# Patient Record
Sex: Male | Born: 2000 | Race: Black or African American | Hispanic: No | Marital: Single | Smoking: Never smoker
Health system: Southern US, Community
[De-identification: ages and names within clinical notes are randomized; demographics above are authoritative.]

## PROBLEM LIST (undated history)

## (undated) DIAGNOSIS — F909 Attention-deficit hyperactivity disorder, unspecified type: Secondary | ICD-10-CM

## (undated) HISTORY — PX: CARDIAC SURGERY: SHX584

## (undated) HISTORY — PX: KNEE SURGERY: SHX244

---
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
## 2021-05-05 ENCOUNTER — Ambulatory Visit: Payer: 59 | Admitting: Nurse Practitioner

## 2021-05-05 ENCOUNTER — Encounter: Payer: Self-pay | Admitting: Nurse Practitioner

## 2021-05-05 ENCOUNTER — Other Ambulatory Visit: Payer: Self-pay

## 2021-05-05 VITALS — BP 112/60 | HR 81 | Temp 98.5°F | Ht 69.6 in | Wt 190.8 lb

## 2021-05-05 DIAGNOSIS — Q245 Malformation of coronary vessels: Secondary | ICD-10-CM | POA: Diagnosis not present

## 2021-05-05 DIAGNOSIS — Z8659 Personal history of other mental and behavioral disorders: Secondary | ICD-10-CM | POA: Diagnosis not present

## 2021-05-05 DIAGNOSIS — Z7689 Persons encountering health services in other specified circumstances: Secondary | ICD-10-CM

## 2021-05-05 NOTE — Patient Instructions (Signed)
HPV and Cancer Information HPV (human papillomavirus)is a very common virus that spreads easily from person to person through skin-to-skin or sexual contact. There are many types of HPV. It often does not cause symptoms. However, depending upon the type, it may sometimes cause warts in the genitals (genital or mucosal HPV), or on the hands or feet (cutaneous or nonmucosal HPV). It is possible to be infected for a long time and pass HPV to others withoutknowing it. Some HPV infections go away on their own within 2 years, but other HPV infections are considered high-risk and may cause changes in cells that could lead to cancer. You can take steps to avoid HPV infection and to lower yourrisk of getting cancer. How can HPV affect me? HPV can cause warts in the genitals or on the hands or feet. It can also causewart-like lesions in the throat.  Certain types of genital HPV can also cause cancer, which may include: Cervical cancer. Vaginal cancer. Vulvar cancer. Anal cancer. Throat cancer. Tongue or mouth cancer. Penile cancer. How does HPV spread? HPV spreads easily through direct person to person contact. Genital HPV spreads through sexual contact. You can get HPV from vaginal sex, oral sex, anal sex, or just by touching someone's genitals. Even people who have only one sexual partner may have HPV because that partner may have it. HPV often does not causesymptoms, so most infected people do not know that they have it. What actions can I take to prevent HPV? Take the following steps to help prevent HPV infection: Talk with your health care provider about getting the HPV vaccine. This vaccine protects against the types of HPV that could cause cancer. Limit the number of people you have sex with. Also, avoid having sex with people who have had many sexual partners. Use a condom during sex. Talk with your sexual partners about their health. What actions can I take to lower my risk for cancer? Having a  healthy lifestyle and taking some preventive steps can help lower your cancer risk, whether or not you have genital HPV. Some steps you can takeinclude: Lifestyle Practice safe sex to help prevent HPV infection. Do not use any products that contain nicotine or tobacco, such as cigarettes, e-cigarettes, and chewing tobacco. If you need help quitting, ask your health care provider. Eat foods that have antioxidants, such as fruits, vegetables, and grains. Try to eat at least 5 servings of fruits and vegetables every day. Get regular exercise. Lose weight if you are overweight. Practice good oral hygiene. This includes flossing and brushing your teeth every day. Other preventive steps Get the HPV vaccine as told by your health care provider. Get tested for STIs even if you do not have symptoms of HPV. You may have HPV and not know it. If you are a woman, get regular Pap and HPV tests. Talk with your health care provider about how often you need these tests. Pap tests will help identify changes in cells that can lead to cancer. HPV tests will help identify the presence of HPV in cells in the cervix. Where to find more information Learn more about HPV and cancer from: Centers for Disease Control and Prevention: www.cdc.gov/hpv National Cancer Institute: www.cancer.gov American Cancer Society: www.cancer.org Contact a health care provider if: You have genital warts. You are sexually active and think you may have HPV. You did not protect yourself during sex and would like to be tested for STIs. Summary Human papillomavirus (HPV) is a very common virus that   spreads easily from person to person and ishighly contagious. Certain types of genital HPV are considered to be high risk and may cause changes in cells that could lead to cancer. You should take steps to avoid HPV infection, such as limiting the number of people you have sex with, using condoms during sex, and getting the HPV vaccine. Lifestyle  changes can help lower your risk of cancer. These include eating a healthy diet, getting regular exercise, and not using any products that contain nicotine or tobacco. You may have HPV and not know it. Get tested for STIs even if you do not have symptoms of HPV. If you are a woman, have regular Pap tests and HPV tests as directed by your health care provider. This information is not intended to replace advice given to you by your health care provider. Make sure you discuss any questions you have with your healthcare provider. Document Revised: 05/06/2020 Document Reviewed: 05/06/2020 Elsevier Patient Education  2022 Elsevier Inc.  

## 2021-05-05 NOTE — Progress Notes (Signed)
I,Alvin Diffee Eichenberger,acting as a Neurosurgeon for Arnette Felts, FNP.,have documented all relevant documentation on the behalf of Arnette Felts, FNP,as directed by  Arnette Felts, FNP while in the presence of Arnette Felts, FNP.  This visit occurred during the SARS-CoV-2 public health emergency.  Safety protocols were in place, including screening questions prior to the visit, additional usage of staff PPE, and extensive cleaning of exam room while observing appropriate contact time as indicated for disinfecting solutions.  Subjective:     Patient ID: Christian Rhodes , male    DOB: 28-Apr-2001 , 20 y.o.   MRN: [id number]   Chief Complaint  Patient presents with   Establish Care     HPI  Pt is here today to establish care. He was referred by his father. Previous PCP, Dr. Hyacinth Meeker at [facility]. He was last seen 2 months ago for his physical. He is leaving to go to [employer] as a Freshman in 2 weeks - undecided major. Single. He is sexually active. He dates woman only.   Pt also stated he no longer takes med for ADHD, metadate 30MG  - he has not taken any medications since age [age] years old.   Reports a history of heart surgery as a baby for valve issue - 2018 he thinks tricuspid valve coming off the wrong way - elective - to allow him to play sports. Done at [facility] - continues to see [facility].       Past Medical History:  Diagnosis Date   Attention deficit hyperactivity disorder (ADHD)      Family History  Problem Relation Age of Onset   Hypertension Father      Current Outpatient Medications:    albuterol (PROVENTIL HFA;VENTOLIN HFA) 108 (90 BASE) MCG/ACT inhaler, Inhale 4 puffs into the lungs every 4 (four) hours as needed for wheezing or shortness of breath., Disp: 1 Inhaler, Rfl: 0   aspirin 81 MG chewable tablet, Chew by mouth., Disp: , Rfl:    loratadine (CLARITIN) 10 MG tablet, Take 10 mg by mouth daily as needed for allergies., Disp: , Rfl:    No Known  Allergies   Review of Systems  Constitutional: Negative.   HENT: Negative.    Eyes: Negative.   Respiratory: Negative.    Cardiovascular: Negative.   Gastrointestinal: Negative.   Endocrine: Negative.   Genitourinary: Negative.   Musculoskeletal: Negative.   Skin: Negative.   Allergic/Immunologic: Negative.   Neurological: Negative.   Hematological: Negative.   Psychiatric/Behavioral: Negative.      Today's Vitals   05/05/21 1409  BP: 112/60  Pulse: 81  Temp: 98.5 F (36.9 C)  TempSrc: Oral  Weight: 190 lb 12.8 oz (86.5 kg)  Height: 5' 9.6" (1.768 m)  PainSc: 0-No pain   Body mass index is 27.69 kg/m.  Wt Readings from Last 3 Encounters:  05/05/21 190 lb 12.8 oz (86.5 kg) (89 %, Z= 1.24)*  12/26/16 141 lb 5 oz (64.1 kg) (78 %, Z= 0.78)*  03/19/14 86 lb 3.2 oz (39.1 kg) (43 %, Z= -0.18)*   * Growth percentiles are based on CDC (Boys, 2-20 Years) data.    Objective:  Physical Exam Vitals reviewed.  Constitutional:      General: He is not in acute distress.    Appearance: Normal appearance.  Cardiovascular:     Rate and Rhythm: Normal rate and regular rhythm.     Pulses: Normal pulses.     Heart sounds: Normal heart sounds.  Pulmonary:  Effort: Pulmonary effort is normal. No respiratory distress.     Breath sounds: Normal breath sounds. No wheezing.  Neurological:     General: No focal deficit present.     Mental Status: He is alert and oriented to person, place, and time.     Cranial Nerves: No cranial nerve deficit.     Motor: No weakness.  Psychiatric:        Mood and Affect: Mood normal.        Behavior: Behavior normal.        Thought Content: Thought content normal.        Judgment: Judgment normal.        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 Dilley Bing  2. History of ADHD No longer taking medications and has not needed since age  19-10  3. Encounter to establish care  He has had a physical at his Pediatrician office  Will get his records    Patient was given opportunity to ask questions. Patient verbalized understanding of the plan and was able to repeat key elements of the plan. All questions were answered to their satisfaction.  Arnette Felts, FNP   I, Arnette Felts, FNP, have reviewed all documentation for this visit. The documentation on 05/05/21 for the exam, diagnosis, procedures, and orders are all accurate and complete.   IF YOU HAVE BEEN REFERRED TO A SPECIALIST, IT MAY TAKE 1-2 WEEKS TO SCHEDULE/PROCESS THE REFERRAL. IF YOU HAVE NOT HEARD FROM US/SPECIALIST IN TWO WEEKS, PLEASE GIVE [place] A CALL AT [phone] X [phone].   THE PATIENT IS ENCOURAGED TO PRACTICE SOCIAL DISTANCING DUE TO THE COVID-19 PANDEMIC.

## 2021-05-12 ENCOUNTER — Encounter: Payer: Self-pay | Admitting: Nurse Practitioner

## 2021-07-10 ENCOUNTER — Encounter: Payer: Self-pay | Admitting: Nurse Practitioner

## 2022-03-04 ENCOUNTER — Encounter: Payer: 59 | Admitting: Nurse Practitioner

## 2022-05-10 ENCOUNTER — Other Ambulatory Visit: Payer: Self-pay | Admitting: Nurse Practitioner

## 2022-05-10 ENCOUNTER — Encounter: Payer: Self-pay | Admitting: Nurse Practitioner

## 2022-05-10 ENCOUNTER — Ambulatory Visit (INDEPENDENT_AMBULATORY_CARE_PROVIDER_SITE_OTHER): Payer: BC Managed Care – PPO | Admitting: Nurse Practitioner

## 2022-05-10 VITALS — BP 122/64 | HR 62 | Temp 98.4°F | Ht 73.0 in | Wt 192.0 lb

## 2022-05-10 DIAGNOSIS — Z1159 Encounter for screening for other viral diseases: Secondary | ICD-10-CM

## 2022-05-10 DIAGNOSIS — Q245 Malformation of coronary vessels: Secondary | ICD-10-CM

## 2022-05-10 DIAGNOSIS — H6121 Impacted cerumen, right ear: Secondary | ICD-10-CM

## 2022-05-10 DIAGNOSIS — H6123 Impacted cerumen, bilateral: Secondary | ICD-10-CM

## 2022-05-10 DIAGNOSIS — Z0000 Encounter for general adult medical examination without abnormal findings: Secondary | ICD-10-CM | POA: Diagnosis not present

## 2022-05-10 DIAGNOSIS — Z114 Encounter for screening for human immunodeficiency virus [HIV]: Secondary | ICD-10-CM

## 2022-05-10 DIAGNOSIS — Z13228 Encounter for screening for other metabolic disorders: Secondary | ICD-10-CM

## 2022-05-10 DIAGNOSIS — H6122 Impacted cerumen, left ear: Secondary | ICD-10-CM | POA: Diagnosis not present

## 2022-05-10 NOTE — Patient Instructions (Signed)
Health Maintenance, Male Adopting a healthy lifestyle and getting preventive care are important in promoting health and wellness. Ask your health care provider about: The right schedule for you to have regular tests and exams. Things you can do on your own to prevent diseases and keep yourself healthy. What should I know about diet, weight, and exercise? Eat a healthy diet  Eat a diet that includes plenty of vegetables, fruits, low-fat dairy products, and lean protein. Do not eat a lot of foods that are high in solid fats, added sugars, or sodium. Maintain a healthy weight Body mass index (BMI) is a measurement that can be used to identify possible weight problems. It estimates body fat based on height and weight. Your health care provider can help determine your BMI and help you achieve or maintain a healthy weight. Get regular exercise Get regular exercise. This is one of the most important things you can do for your health. Most adults should: Exercise for at least 150 minutes each week. The exercise should increase your heart rate and make you sweat (moderate-intensity exercise). Do strengthening exercises at least twice a week. This is in addition to the moderate-intensity exercise. Spend less time sitting. Even light physical activity can be beneficial. Watch cholesterol and blood lipids Have your blood tested for lipids and cholesterol at 20 years of age, then have this test every 5 years. You may need to have your cholesterol levels checked more often if: Your lipid or cholesterol levels are high. You are older than 20 years of age. You are at high risk for heart disease. What should I know about cancer screening? Many types of cancers can be detected early and may often be prevented. Depending on your health history and family history, you may need to have cancer screening at various ages. This may include screening for: Colorectal cancer. Prostate cancer. Skin cancer. Lung  cancer. What should I know about heart disease, diabetes, and high blood pressure? Blood pressure and heart disease High blood pressure causes heart disease and increases the risk of stroke. This is more likely to develop in people who have high blood pressure readings or are overweight. Talk with your health care provider about your target blood pressure readings. Have your blood pressure checked: Every 3-5 years if you are 18-39 years of age. Every year if you are 40 years old or older. If you are between the ages of 65 and 75 and are a current or former smoker, ask your health care provider if you should have a one-time screening for abdominal aortic aneurysm (AAA). Diabetes Have regular diabetes screenings. This checks your fasting blood sugar level. Have the screening done: Once every three years after age 45 if you are at a normal weight and have a low risk for diabetes. More often and at a younger age if you are overweight or have a high risk for diabetes. What should I know about preventing infection? Hepatitis B If you have a higher risk for hepatitis B, you should be screened for this virus. Talk with your health care provider to find out if you are at risk for hepatitis B infection. Hepatitis C Blood testing is recommended for: Everyone born from 1945 through 1965. Anyone with known risk factors for hepatitis C. Sexually transmitted infections (STIs) You should be screened each year for STIs, including gonorrhea and chlamydia, if: You are sexually active and are younger than 20 years of age. You are older than 20 years of age and your   health care provider tells you that you are at risk for this type of infection. Your sexual activity has changed since you were last screened, and you are at increased risk for chlamydia or gonorrhea. Ask your health care provider if you are at risk. Ask your health care provider about whether you are at high risk for HIV. Your health care provider  may recommend a prescription medicine to help prevent HIV infection. If you choose to take medicine to prevent HIV, you should first get tested for HIV. You should then be tested every 3 months for as long as you are taking the medicine. Follow these instructions at home: Alcohol use Do not drink alcohol if your health care provider tells you not to drink. If you drink alcohol: Limit how much you have to 0-2 drinks a day. Know how much alcohol is in your drink. In the [place] one drink equals one 12 oz bottle of beer (355 mL), one 5 oz glass of wine (148 mL), or one 1 oz glass of hard liquor (44 mL). Lifestyle Do not use any products that contain nicotine or tobacco. These products include cigarettes, chewing tobacco, and vaping devices, such as e-cigarettes. If you need help quitting, ask your health care provider. Do not use street drugs. Do not share needles. Ask your health care provider for help if you need support or information about quitting drugs. General instructions Schedule regular health, dental, and eye exams. Stay current with your vaccines. Tell your health care provider if: You often feel depressed. You have ever been abused or do not feel safe at home. Summary Adopting a healthy lifestyle and getting preventive care are important in promoting health and wellness. Follow your health care provider's instructions about healthy diet, exercising, and getting tested or screened for diseases. Follow your health care provider's instructions on monitoring your cholesterol and blood pressure. This information is not intended to replace advice given to you by your health care provider. Make sure you discuss any questions you have with your health care provider. Document Revised: 02/09/2021 Document Reviewed: 02/09/2021 Elsevier Patient Education  2023 Elsevier Inc.  

## 2022-05-10 NOTE — Progress Notes (Signed)
I,Tianna Badgett,acting as a Education administrator for [employer], FNP.,have documented all relevant documentation on the behalf of Minette Brine, FNP,as directed by  Minette Brine, FNP while in the presence of Minette Brine, [place].  Subjective:     Patient ID: Christian Rhodes , male    DOB: 30-Nov-2000 , 21 y.o.   MRN: [id number]   Chief Complaint  Patient presents with   Annual Exam    HPI  Here for HM. Continues to go to [place] for his cardiologist. No current issues or concerns.  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      Past Medical History:  Diagnosis Date   Attention deficit hyperactivity disorder (ADHD)      Family History  Problem Relation Age of Onset   Hypertension Father      Current Outpatient Medications:    albuterol (PROVENTIL HFA;VENTOLIN HFA) 108 (90 BASE) MCG/ACT inhaler, Inhale 4 puffs into the lungs every 4 (four) hours as needed for wheezing or shortness of breath., Disp: 1 Inhaler, Rfl: 0   aspirin 81 MG chewable tablet, Chew by mouth., Disp: , Rfl:    loratadine (CLARITIN) 10 MG tablet, Take 10 mg by mouth daily as needed for allergies., Disp: , Rfl:    No Known Allergies    Exercising - daily - Mon - Friday, total 2 hours with mostly strength training. Diet - regular.   The patient's tobacco use is:  Social History   Tobacco Use  Smoking Status Never  Smokeless Tobacco Never   he has been exposed to passive smoke. The patient's alcohol use is:  Social History   Substance and Sexual Activity  Alcohol Use Not Currently   Social History   Substance and Sexual Activity  Sexual Activity Yes    Review of Systems  Constitutional: Negative.   Respiratory: Negative.    Cardiovascular: Negative.   Gastrointestinal: Negative.   Neurological: Negative.      Today's Vitals   05/10/22 1430  BP: 122/64  Pulse: 62  Temp: 98.4 F (36.9 C)  TempSrc: Oral   Weight: 192 lb (87.1 kg)  Height: 6' 1" (1.854 m)   Body mass index is 25.33 kg/m.  Wt Readings from Last 3 Encounters:  05/10/22 192 lb (87.1 kg)  05/05/21 190 lb 12.8 oz (86.5 kg) (89 %, Z= 1.24)*  12/26/16 141 lb 5 oz (64.1 kg) (78 %, Z= 0.78)*   * Growth percentiles are based on CDC (Boys, 2-20 Years) data.    Objective:  Physical Exam Vitals reviewed.  Constitutional:      General: He is not in acute distress.    Appearance: Normal appearance. He is obese.  HENT:     Head: Normocephalic and atraumatic.     Right Ear: Tympanic membrane, ear canal and external ear normal. There is impacted cerumen.     Left Ear: Tympanic membrane, ear canal and external ear normal. There is impacted cerumen.  Cardiovascular:     Rate and Rhythm: Normal rate and regular rhythm.     Pulses: Normal pulses.     Heart sounds: Normal heart sounds. No murmur heard. Pulmonary:     Effort: Pulmonary effort is normal. No respiratory distress.     Breath sounds: Normal breath sounds. No wheezing.  Abdominal:     General: Abdomen is flat. Bowel sounds are normal. There is no distension.     Palpations: Abdomen is soft.  Genitourinary:    Prostate: Normal.     Rectum: Guaiac result negative.  Musculoskeletal:        General: Normal range of motion.     Cervical back: Normal range of motion and neck supple.  Skin:    General: Skin is warm.     Capillary Refill: Capillary refill takes less than 2 seconds.  Neurological:     General: No focal deficit present.     Mental Status: He is alert and oriented to person, place, and time.     Cranial Nerves: No cranial nerve deficit.     Motor: No weakness.  Psychiatric:        Mood and Affect: Mood normal.        Behavior: Behavior normal.        Thought Content: Thought content normal.        Judgment: Judgment normal.         Assessment And Plan:     1. Routine general medical examination at health care facility Behavior modifications  discussed and diet history reviewed.   Pt will continue to exercise regularly and modify diet with low GI, plant based foods and decrease intake of processed foods.  Recommend intake of daily multivitamin, Vitamin D, and calcium.  Recommend for preventive screenings, as well as recommend immunizations that include influenza, TDAP - CBC - CMP14+EGFR - Lipid panel  2. Encounter for hepatitis C screening test for low risk patient Will check Hepatitis C screening due to recent recommendations to screen all adults 18 years and older - Hepatitis C antibody  3. Screening for HIV without presence of risk factors  4. 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 Bilateral impacted cerumen Wax is removed by with lavage with elephant ear with 1/2 water and 1/2 peroxide. Instructions for home care to prevent wax buildup are given. - Ear Lavage   Patient was given opportunity to ask questions. Patient verbalized understanding of the plan and was able to repeat key elements of the plan. All questions were answered to their satisfaction.   Minette Brine, FNP   I, Minette Brine, FNP, have reviewed all documentation for this visit. The documentation on 05/10/22 for the exam, diagnosis, procedures, and orders are all accurate and complete.   THE PATIENT IS ENCOURAGED TO PRACTICE SOCIAL DISTANCING DUE TO THE COVID-19 PANDEMIC.

## 2022-05-11 LAB — CMP14+EGFR
ALT: 20 IU/L (ref 0–44)
AST: 18 IU/L (ref 0–40)
Albumin/Globulin Ratio: 1.6 (ref 1.2–2.2)
Albumin: 4.7 g/dL (ref 4.3–5.2)
Alkaline Phosphatase: 94 IU/L (ref 51–125)
BUN/Creatinine Ratio: 14 (ref 9–20)
BUN: 15 mg/dL (ref 6–20)
Bilirubin Total: 0.4 mg/dL (ref 0.0–1.2)
CO2: 23 mmol/L (ref 20–29)
Calcium: 10 mg/dL (ref 8.7–10.2)
Chloride: 98 mmol/L (ref 96–106)
Creatinine, Ser: 1.1 mg/dL (ref 0.76–1.27)
Globulin, Total: 2.9 g/dL (ref 1.5–4.5)
Glucose: 81 mg/dL (ref 70–99)
Potassium: 4.8 mmol/L (ref 3.5–5.2)
Sodium: 138 mmol/L (ref 134–144)
Total Protein: 7.6 g/dL (ref 6.0–8.5)
eGFR: 99 mL/min/{1.73_m2} (ref >59)

## 2022-05-11 LAB — HEMOGLOBIN A1C
Est. average glucose Bld gHb Est-mCnc: 111 mg/dL
Hgb A1c MFr Bld: 5.5 % (ref 4.8–5.6)

## 2022-05-11 LAB — LIPID PANEL
Chol/HDL Ratio: 3.2 ratio (ref 0.0–5.0)
Cholesterol, Total: 217 mg/dL — ABNORMAL HIGH (ref 100–199)
HDL: 67 mg/dL (ref >39)
LDL Chol Calc (NIH): 137 mg/dL — ABNORMAL HIGH (ref 0–99)
Triglycerides: 76 mg/dL (ref 0–149)
VLDL Cholesterol Cal: 13 mg/dL (ref 5–40)

## 2022-05-11 LAB — CBC
Hematocrit: 44.5 % (ref 37.5–51.0)
Hemoglobin: 15 g/dL (ref 13.0–17.7)
MCH: 27.4 pg (ref 26.6–33.0)
MCHC: 33.7 g/dL (ref 31.5–35.7)
MCV: 81 fL (ref 79–97)
Platelets: 225 10*3/uL (ref 150–450)
RBC: 5.48 x10E6/uL (ref 4.14–5.80)
RDW: 12.5 % (ref 11.6–15.4)
WBC: 6.5 10*3/uL (ref 3.4–10.8)

## 2022-05-11 LAB — HIV ANTIBODY (ROUTINE TESTING W REFLEX): HIV Screen 4th Generation wRfx: NONREACTIVE

## 2022-05-11 LAB — HEPATITIS C ANTIBODY: Hep C Virus Ab: NONREACTIVE

## 2022-06-08 ENCOUNTER — Ambulatory Visit: Payer: Self-pay

## 2023-04-12 ENCOUNTER — Encounter (HOSPITAL_BASED_OUTPATIENT_CLINIC_OR_DEPARTMENT_OTHER): Payer: Self-pay | Admitting: Emergency Medicine

## 2023-04-12 ENCOUNTER — Other Ambulatory Visit (HOSPITAL_BASED_OUTPATIENT_CLINIC_OR_DEPARTMENT_OTHER): Payer: Self-pay

## 2023-04-12 ENCOUNTER — Emergency Department (HOSPITAL_BASED_OUTPATIENT_CLINIC_OR_DEPARTMENT_OTHER)
Admission: EM | Admit: 2023-04-12 | Discharge: 2023-04-12 | Disposition: A | Discharge status: Home / Self Care | Payer: 59 | Attending: Emergency Medicine | Admitting: Emergency Medicine

## 2023-04-12 ENCOUNTER — Other Ambulatory Visit: Payer: Self-pay

## 2023-04-12 DIAGNOSIS — L235 Allergic contact dermatitis due to other chemical products: Secondary | ICD-10-CM | POA: Insufficient documentation

## 2023-04-12 DIAGNOSIS — T7840XA Allergy, unspecified, initial encounter: Secondary | ICD-10-CM | POA: Diagnosis present

## 2023-04-12 MED ORDER — DIPHENHYDRAMINE-ZINC ACETATE 1-0.1 % EX CREA: TOPICAL_CREAM | Freq: Three times a day (TID) | CUTANEOUS | 0 refills | Status: DC | PRN

## 2023-04-12 MED ORDER — DIPHENHYDRAMINE HCL 25 MG PO CAPS
50.0000 mg | ORAL_CAPSULE | Freq: Once | ORAL | Status: AC
  Administered 2023-04-12: 50 mg via ORAL
  Filled 2023-04-12: qty 2

## 2023-04-12 MED ORDER — PREDNISONE 20 MG PO TABS: 20.0000 mg | ORAL_TABLET | Freq: Every day | ORAL | 0 refills | Status: DC

## 2023-04-12 MED ORDER — DIPHENHYDRAMINE HCL 25 MG PO TABS: 50.0000 mg | ORAL_TABLET | Freq: Two times a day (BID) | ORAL | 0 refills | Status: DC | PRN

## 2023-04-12 MED ORDER — DIPHENHYDRAMINE HCL 25 MG PO TABS: 50.0000 mg | ORAL_TABLET | Freq: Two times a day (BID) | ORAL | 0 refills | Status: AC | PRN

## 2023-04-12 MED ORDER — PREDNISONE 20 MG PO TABS
20.0000 mg | ORAL_TABLET | Freq: Once | ORAL | Status: AC
  Administered 2023-04-12: 20 mg via ORAL
  Filled 2023-04-12: qty 1

## 2023-04-12 MED ORDER — PREDNISONE 20 MG PO TABS: 20.0000 mg | ORAL_TABLET | Freq: Every day | ORAL | 0 refills | Status: AC

## 2023-04-12 MED ORDER — DIPHENHYDRAMINE-ZINC ACETATE 2-0.1 % EX CREA: TOPICAL_CREAM | CUTANEOUS | Status: DC | PRN

## 2023-04-12 NOTE — ED Triage Notes (Signed)
Pt arrives to ED with c/o possible allergic reaction to razorlesss cream shave to his face after causing a rash and burning to his face.

## 2023-04-12 NOTE — Discharge Instructions (Addendum)
It looks like you are having an allergic reaction to the shave cream. You will want to avoid this going forward. We will send you home with some allergy meds to help improve symptoms.   Allergy meds to help with symptoms: Take 50 mg of Benadryl twice a day as needed, for burning/pain/redness for 5 days  Take 20 mg of Prednisone daily for 5 days  Follow up with your PCP, to discuss allergic reaction.   Seek Immediate Medical Care if: Your redness/pain is spreading You develop difficulties breathing/swallowing You develop light headedness/dizziness You develop nausea/vomiting/diarrhea If redness/pain spread's or gets worse

## 2023-04-12 NOTE — ED Provider Notes (Signed)
[place] EMERGENCY DEPARTMENT AT [facility] Provider Note   CSN: [id number] Arrival date & time: 04/12/23  4098     History  Chief Complaint  Patient presents with   Allergic Reaction    Christian Rhodes is a 22 y.o. male.  Patient comes in for rash on face, that began last night.  Patient notes he attempted to use a no shave, shaving cream on his face, and after leaving off for short time and then attempting to remove his face got red and began to burn.  He notes his face has been burning since.  He notes approximately 2 weeks ago he attempted to shave with a razor but this caused a lot of bumps leading to him using this shave list cream, which was working previously.  He denies any fever, nausea vomiting, diarrhea.  He is otherwise in his normal state of health.  He denies any issues with breathing or swallowing.  The history is provided by the patient and a parent.  Allergic Reaction Presenting symptoms: rash   Presenting symptoms: no difficulty breathing, no difficulty swallowing, no itching, no swelling and no wheezing   Severity:  Moderate Duration:  12 hours Prior allergic episodes:  Unable to specify Context: cosmetics        Home Medications Prior to Admission medications   Medication Sig Start Date End Date Taking? Authorizing Provider  albuterol (PROVENTIL HFA;VENTOLIN HFA) 108 (90 BASE) MCG/ACT inhaler Inhale 4 puffs into the lungs every 4 (four) hours as needed for wheezing or shortness of breath. 03/06/14   Marcellina Millin, MD  aspirin 81 MG chewable tablet Chew by mouth. 09/24/16   [provider]  loratadine (CLARITIN) 10 MG tablet Take 10 mg by mouth daily as needed for allergies.    [provider]      Allergies    Patient has no known allergies.    Review of Systems   Review of Systems  Constitutional:  Negative for activity change and fever.  HENT:  Negative for trouble swallowing.   Respiratory:  Negative for choking,  shortness of breath and wheezing.   Gastrointestinal:  Negative for diarrhea, nausea and vomiting.  Skin:  Positive for rash. Negative for itching.    Physical Exam Updated Vital Signs BP (!) 142/88 (BP Location: Left Arm)   Pulse 70   Temp 98.1 F (36.7 C) (Oral)   Resp 16   Ht 6\' 1"  (1.854 m)   Wt 83.9 kg   SpO2 100%   BMI 24.41 kg/m  Physical Exam Constitutional:      General: He is not in acute distress.    Appearance: Normal appearance. He is normal weight. He is not ill-appearing.  Skin:    Findings: Erythema and rash present. Rash is macular.     Comments: TTP on overlying erythema, no pustules or drainage  Neurological:     Mental Status: He is alert.     ED Results / Procedures / Treatments   Labs (all labs ordered are listed, but only abnormal results are displayed) Labs Reviewed - No data to display  EKG None  Radiology No results found.  Procedures Procedures    Medications Ordered in ED Medications - No data to display  ED Course/ Medical Decision Making/ A&P                             Medical Decision Making Patient comes in for rash that  started last night, after using shaveless face cream.  Patient has used this cream in the past without issue, noticed that he was using the cream as when he shaved it was causing bumps.  Patient notes he tried to use aloe vera on face last night with no relief.  Patient's face with erythema and patchiness located around beard area.  Area tender to palpation.  Patient appears to be having allergic reaction, as opposed to infectious reaction, or dry skin, given history, and acuteness of problem. Patient was treated with benadryl and steroids, and felt to be appropriate for d/c home. Patient was d/c home w/ 5 days of meds (steroids/antihistamine).           Final Clinical Impression(s) / ED Diagnoses Final diagnoses:  None    Rx / DC Orders ED Discharge Orders     None         Bess Kinds,  MD 04/12/23 1610    Maia Plan, MD 04/14/23 [phone]

## 2023-04-12 NOTE — ED Notes (Signed)
Pt given discharge instructions and reviewed prescriptions. Opportunities given for questions. Pt verbalizes understanding. Dany Walther R, RN 

## 2023-04-14 ENCOUNTER — Telehealth: Payer: Self-pay

## 2023-04-14 NOTE — Transitions of Care (Post Inpatient/ED Visit) (Signed)
   04/14/2023  Name: Myson Levi MRN: [id number] DOB: [birth year]  Today's TOC FU Call Status: Today's TOC FU Call Status:: Unsuccessul Call (1st Attempt) Unsuccessful Call (1st Attempt) Date: 04/14/23  Attempted to reach the patient regarding the most recent Inpatient/ED visit.  Follow Up Plan: Additional outreach attempts will be made to reach the patient to complete the Transitions of Care (Post Inpatient/ED visit) call.   Signature YL,RMA

## 2023-04-27 ENCOUNTER — Ambulatory Visit: Payer: 59 | Admitting: Family Medicine

## 2023-04-27 ENCOUNTER — Encounter: Payer: Self-pay | Admitting: Family Medicine

## 2023-04-27 VITALS — BP 120/80 | HR 74 | Temp 98.4°F | Ht 73.0 in | Wt 182.0 lb

## 2023-04-27 DIAGNOSIS — Z23 Encounter for immunization: Secondary | ICD-10-CM | POA: Diagnosis not present

## 2023-04-27 DIAGNOSIS — E041 Nontoxic single thyroid nodule: Secondary | ICD-10-CM | POA: Diagnosis not present

## 2023-04-27 NOTE — Progress Notes (Signed)
I,Jameka J Llittleton, CMA,acting as a Neurosurgeon for [employer], NP.,have documented all relevant documentation on the behalf of PAT OHENHEN, NP,as directed by  PAT Moshe Salisbury, NP while in the presence of PAT OHENHEN, NP.  Subjective:  Patient ID: Christian Rhodes , male    DOB: [birth year] , 22 y.o.   MRN: [id number]  Chief Complaint  Patient presents with   Cyst    HPI  Patient is a 22 year old male, who presents today for a painless knot  on the left side of his neck, states he noticed it 2 days ago, denies any pain, will order an US Thyroid but get labs today. Patient denies knowledge of thyroid cancer in his family.     Past Medical History:  Diagnosis Date   Attention deficit hyperactivity disorder (ADHD)      Family History  Problem Relation Age of Onset   Hypertension Father      Current Outpatient Medications:    aspirin 81 MG chewable tablet, Chew by mouth., Disp: , Rfl:    loratadine (CLARITIN) 10 MG tablet, Take 10 mg by mouth daily as needed for allergies., Disp: , Rfl:    albuterol (PROVENTIL HFA;VENTOLIN HFA) 108 (90 BASE) MCG/ACT inhaler, Inhale 4 puffs into the lungs every 4 (four) hours as needed for wheezing or shortness of breath. (Patient not taking: Reported on 04/27/2023), Disp: 1 Inhaler, Rfl: 0   diphenhydrAMINE (BENADRYL) 25 MG tablet, Take 2 tablets (50 mg total) by mouth every 12 (twelve) hours as needed for up to 5 days., Disp: 20 tablet, Rfl: 0   No Known Allergies   Review of Systems  Constitutional: Negative.   HENT: Negative.    Eyes: Negative.   Respiratory: Negative.    Cardiovascular: Negative.   Endocrine: Negative for cold intolerance and heat intolerance.  Musculoskeletal:  Negative for neck pain and neck stiffness.  Skin: Negative.   Neurological: Negative.   Psychiatric/Behavioral: Negative.       Today's Vitals   04/27/23 1431  BP: 120/80  Pulse: 74  Temp: 98.4 F (36.9 C)  Weight: 182 lb (82.6 kg)  Height: 6\' 1"  (1.854 m)   PainSc: 0-No pain   Body mass index is 24.01 kg/m.  Wt Readings from Last 3 Encounters:  04/27/23 182 lb (82.6 kg)  04/12/23 185 lb (83.9 kg)  05/10/22 192 lb (87.1 kg)     Objective:  Physical Exam Neck:     Thyroid: Thyromegaly present. No thyroid mass or thyroid tenderness.  Musculoskeletal:     Cervical back: Normal range of motion.  Neurological:     Mental Status: He is alert.         Assessment And Plan:  Thyroid nodule -     US THYROID; Future -     TSH -     T4 -     T3 -     BMP8+eGFR  Need for Tdap vaccination -     Tdap vaccine greater than or equal to 7yo IM     Return if symptoms worsen or fail to improve.  Patient was given opportunity to ask questions. Patient verbalized understanding of the plan and was able to repeat key elements of the plan. All questions were answered to their satisfaction.  PAT Moshe Salisbury, NP  I, PAT Moshe Salisbury, NP, have reviewed all documentation for this visit. The documentation on 04/29/23 for the exam, diagnosis, procedures, and orders are all accurate and complete.   IF YOU HAVE BEEN REFERRED TO  A SPECIALIST, IT MAY TAKE 1-2 WEEKS TO SCHEDULE/PROCESS THE REFERRAL. IF YOU HAVE NOT HEARD FROM US/SPECIALIST IN TWO WEEKS, PLEASE GIVE [place] A CALL AT [phone] X [phone].   THE PATIENT IS ENCOURAGED TO PRACTICE SOCIAL DISTANCING DUE TO THE COVID-19 PANDEMIC.

## 2023-04-28 ENCOUNTER — Other Ambulatory Visit: Payer: 59

## 2023-04-29 LAB — BMP8+EGFR: CO2: 22 mmol/L (ref 20–29)

## 2023-05-02 ENCOUNTER — Other Ambulatory Visit: Payer: Self-pay | Admitting: Family Medicine

## 2023-05-05 ENCOUNTER — Other Ambulatory Visit: Payer: 59

## 2023-05-09 ENCOUNTER — Other Ambulatory Visit: Payer: 59

## 2023-05-16 ENCOUNTER — Encounter: Payer: Self-pay | Admitting: Nurse Practitioner

## 2023-05-24 ENCOUNTER — Encounter: Payer: Self-pay | Admitting: Nurse Practitioner

## 2023-05-24 NOTE — Progress Notes (Deleted)
Madelaine Bhat, CMA,acting as a Neurosurgeon for Arnette Felts, FNP.,have documented all relevant documentation on the behalf of Arnette Felts, FNP,as directed by  Arnette Felts, FNP while in the presence of Arnette Felts, FNP.  Subjective:   Patient ID: Christian Rhodes , male    DOB: 11/09/2000 , 22 y.o.   MRN: [id number]  No chief complaint on file.   HPI  Patient presents today for HM, Patient reports compliance with medications. Patient denies any chest pain, SOB, or headaches. Patient has no other concerns today.     Past Medical History:  Diagnosis Date  . Attention deficit hyperactivity disorder (ADHD)      Family History  Problem Relation Age of Onset  . Hypertension Father      Current Outpatient Medications:  .  albuterol (PROVENTIL HFA;VENTOLIN HFA) 108 (90 BASE) MCG/ACT inhaler, Inhale 4 puffs into the lungs every 4 (four) hours as needed for wheezing or shortness of breath. (Patient not taking: Reported on 04/27/2023), Disp: 1 Inhaler, Rfl: 0 .  aspirin 81 MG chewable tablet, Chew by mouth., Disp: , Rfl:  .  diphenhydrAMINE (BENADRYL) 25 MG tablet, Take 2 tablets (50 mg total) by mouth every 12 (twelve) hours as needed for up to 5 days., Disp: 20 tablet, Rfl: 0 .  loratadine (CLARITIN) 10 MG tablet, Take 10 mg by mouth daily as needed for allergies., Disp: , Rfl:    No Known Allergies   Men's preventive visit. Patient Health Questionnaire (PHQ-2) is  Flowsheet Row Office Visit from 05/10/2022 in [facility]  PHQ-2 Total Score 0     . Patient is on a *** diet. Marital status: Single. Relevant history for alcohol use is:  Social History   Substance and Sexual Activity  Alcohol Use Not Currently  . Relevant history for tobacco use is:  Social History   Tobacco Use  Smoking Status Never  Smokeless Tobacco Never  .   Review of Systems   There were no vitals filed for this visit. There is no height or weight on file to calculate BMI.   Wt Readings from Last 3 Encounters:  04/27/23 182 lb (82.6 kg)  04/12/23 185 lb (83.9 kg)  05/10/22 192 lb (87.1 kg)    Objective:  Physical Exam      Assessment And Plan:    Encounter for annual health examination  Thyroid nodule     No follow-ups on file. Patient was given opportunity to ask questions. Patient verbalized understanding of the plan and was able to repeat key elements of the plan. All questions were answered to their satisfaction.   Arnette Felts, FNP  I, Arnette Felts, FNP, have reviewed all documentation for this visit. The documentation on 05/24/23 for the exam, diagnosis, procedures, and orders are all accurate and complete.

## 2023-12-01 NOTE — Discharge Summary (Signed)
 Observation Unit -  [facility]    [facility]  [address] [place] [address] [phone]  Date:  12/01/23     Admission Date and Time:  12/01/2023  1:18 AM  Discharge Date:  12/01/2023   Discharge Provider: Ritchie Hoehn, PA-C  Primary Care Provider:  Lamar Lonni Pinal, MD    Chief Complaint: Abdominal pain   Discharge Diagnosis: 1. Left lower quadrant abdominal pain      Consulting Physicians: Dr. Charlton Surgeon on call                                                   Details of [facility] Care Course: Pt was place in the Observation Unit for Abdominal pain.  While in the Obs Unit, the patient did well, maintained stable vital signs &  abdominal pain improved to resolution throughout Observation course.  Pt is able to tolerate PO diet and on serial exams there is not evidence of surgical abdomen or other diagnoses requiring hospital admission.  Dr. Charlton surgeon on-call did evaluate patient bedside and did not recommend any surgical intervention at this time.  Patient was able to pass p.o. challenge without any nausea or vomiting.   Given all of the above Pt meets discharge criteria and is deemed appropriate for discharge with out-patient PCP followup.  Pt understands that although that ED course and Observation Unit course did not yield any overt evidence of underlying surgical abdomen or alternate diagnosis requiring hospitalization, close outpt followup with PCP is recommended.      Pt also understands the immediate Return to Emergency Department precautions including but not limited to return of symptoms, development of any new symptoms.    Discharge Medications:   Medication List     START taking these medications    ondansetron 4 mg tablet Commonly known as: ZOFRAN Take 1 tablet (4 mg total) by mouth every 6 (six) hours for 3 days.   polyethylene glycol 17 gram Powd powder Commonly known  as: MIRALAX Take 17 g by mouth daily.       ASK your doctor about these medications    aspirin 81 mg chewable tablet Take 81 mg by mouth Once Daily.   BisaCODYL 5 mg EC tablet Commonly known as: DULCOLAX Take 5 mg by mouth as needed for constipation.   cetirizine 10 mg tablet Commonly known as: ZyrTEC Take 10 mg by mouth Once Daily.   diphenhydrAMINE  25 mg tablet Commonly known as: BENADRYL  Take 25 mg by mouth as needed for allergies.   Flucelvax Quad 2018-2019 (PF) 60 mcg (15 mcg x 4)/0.5 mL Syrg Generic drug: flu vac qs 2018(4 yr up)CD(PF) TO BE ADMINISTERED BY PHARMACIST FOR IMMUNIZATION   ibuprofen 200 mg tablet Commonly known as: MOTRIN Take 600 mg by mouth every 6 (six) hours as needed for mild pain (1-3), moderate pain (4-6) or fever 100.4 F or GREATER.   loratadine 10 mg tablet Commonly known as: CLARITIN Take 10 mg by mouth as needed for allergies.   multivitamin Cap Take 1 capsule by mouth Once Daily.         Where to Get Your Medications     These medications were sent to [facility] [address] [place] - [phone]  [address] AT [facility] [address] - PHONE: [phone] - FAX: [phone]  [address] [place], [place] [address]    Phone: [phone]  ondansetron 4 mg tablet polyethylene glycol 17 gram Powd powder         Physical Exam on Day of Discharge   BP 139/72 (BP Location: Left arm)   Pulse 80   Temp 97.7 F (36.5 C) (Oral)   Resp 15   Wt 82.6 kg (182 lb 1.6 oz)   SpO2 100%   BMI 23.90 kg/m    Constitutional:  Well developed, Well nourished.  No distress HENT:  Normocephalic, Atraumatic, PERRL.  EOMI.  Sclera clear.Conjunctiva normal, No discharge. Neck/Lymphatics: supple, no JVD, no swollen nodes Cardiovascular:   RRR No JVD Good peripheral perfusion Respiratory:   No labored breathing  Abdomen:   Mild discomfort with the left lower quadrant otherwise abdomen is soft, nontender  nondistended.  Musculoskeletal:   There is no edema, asymmetry, or calf / thigh tenderness bilaterally.   No cyanosis.   Integument:  Warm, Dry Neurologic: Alert & oriented , No focal deficits noted.   Cranial nerves II through XII grossly intact.   Normal gross motor coordination & motor strength bilateral upper and lower extremities  Sensation intact. Psychiatric:  Affect normal, Mood normal.      DISCHARGE  PLAN   Discharge Disposition:  home   Discharge Condition:  stable   Diet:  regular Activity:   as tolerated      Pending Follow Up Orders:  None  Follow Up Appointments:    Primary care provider    Pertinent Diagnostic & Test Results: CBC with a slightly elevated white count of 11.89 and a normal hemoglobin.  CMP with no evidence of AKI or transaminitis.  Urine notes small amount of blood otherwise no evidence of infection.  Time Dedicated to Discharge Planning:  35        Aregash Theodros, PA-C      Comment: Please note this report has been produced using speech recognition software and may contain errors related to that system including errors in grammar, punctuation, and spelling, as well as words and phrases that may be inappropriate. If there are any questions or concerns please feel free to contact me for clarification.

## 2024-05-23 ENCOUNTER — Ambulatory Visit: Payer: Self-pay | Admitting: Nurse Practitioner

## 2024-05-23 ENCOUNTER — Encounter: Payer: Self-pay | Admitting: Nurse Practitioner

## 2024-05-23 VITALS — BP 120/80 | HR 59 | Temp 98.5°F | Ht 73.0 in | Wt 194.6 lb

## 2024-05-23 DIAGNOSIS — Z2821 Immunization not carried out because of patient refusal: Secondary | ICD-10-CM

## 2024-05-23 DIAGNOSIS — Z0000 Encounter for general adult medical examination without abnormal findings: Secondary | ICD-10-CM | POA: Diagnosis not present

## 2024-05-23 DIAGNOSIS — Z1159 Encounter for screening for other viral diseases: Secondary | ICD-10-CM

## 2024-05-23 DIAGNOSIS — Q245 Malformation of coronary vessels: Secondary | ICD-10-CM | POA: Diagnosis not present

## 2024-05-23 DIAGNOSIS — Q2381 Bicuspid aortic valve: Secondary | ICD-10-CM

## 2024-05-23 DIAGNOSIS — I491 Atrial premature depolarization: Secondary | ICD-10-CM | POA: Diagnosis not present

## 2024-05-23 DIAGNOSIS — E041 Nontoxic single thyroid nodule: Secondary | ICD-10-CM

## 2024-05-23 DIAGNOSIS — Z113 Encounter for screening for infections with a predominantly sexual mode of transmission: Secondary | ICD-10-CM

## 2024-05-23 DIAGNOSIS — Z13228 Encounter for screening for other metabolic disorders: Secondary | ICD-10-CM

## 2024-05-23 DIAGNOSIS — Z114 Encounter for screening for human immunodeficiency virus [HIV]: Secondary | ICD-10-CM

## 2024-05-23 NOTE — Progress Notes (Signed)
 LILLETTE Kristeen JINNY Gladis, CMA,acting as a Neurosurgeon for Gaines Ada, FNP.,have documented all relevant documentation on the behalf of Gaines Ada, FNP,as directed by  Gaines Ada, FNP while in the presence of Gaines Ada, FNP.  Subjective:   Patient ID: Christian Rhodes , male    DOB: Jan 19, 2001 , 23 y.o.   MRN: [id number]  Chief Complaint  Patient presents with   Annual Exam    Patient presents today for HM, Patient reports compliance with medication. Patient denies any chest pain, SOB, or headaches. Patient has no concerns today. Patient reports he hasn't been taking his Asprin 33.      HPI  Discussed the use of AI scribe software for clinical note transcription with the patient, who gave verbal consent to proceed.  History of Present Illness Christian Rhodes is a 23 year old male who presents for an annual physical exam.  He has no current health concerns and has not seen any other doctors since his last visit. In February, he experienced sharp abdominal pain and visited the ER. The pain was initially thought to be due to constipation or a kidney stone, but a kidney stone was not confirmed. He reports no issues with constipation, diarrhea, or urination.  He has a history of cardiac surgery to correct a valve placement and used to see a cardiologist regularly, but has not had a visit in the past year. He has been advised to take a baby aspirin daily as part of his cardiac care.  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 He walks to class daily and does not participate in intramural sports.  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 His diet consists of a mix of home-cooked meals and fast food, and he drinks water throughout the day.  No recent constipation, diarrhea, or urinary problems. He performs regular self-examinations for testicular lumps and has not noticed any  abnormalities.   Past Medical History:  Diagnosis Date   Attention deficit hyperactivity disorder (ADHD)      Family History  Problem Relation Age of Onset   Hypertension Father      Current Outpatient Medications:    aspirin 81 MG chewable tablet, Chew by mouth., Disp: , Rfl:    loratadine (CLARITIN) 10 MG tablet, Take 10 mg by mouth daily as needed for allergies., Disp: , Rfl:    albuterol  (PROVENTIL  HFA;VENTOLIN  HFA) 108 (90 BASE) MCG/ACT inhaler, Inhale 4 puffs into the lungs every 4 (four) hours as needed for wheezing or shortness of breath. (Patient not taking: Reported on 05/23/2024), Disp: 1 Inhaler, Rfl: 0   diphenhydrAMINE  (BENADRYL ) 25 MG tablet, Take 2 tablets (50 mg total) by mouth every 12 (twelve) hours as needed for up to 5 days., Disp: 20 tablet, Rfl: 0   No Known Allergies   Men's preventive visit. Patient Health Questionnaire (PHQ-2) is  Flowsheet Row Office Visit from 05/23/2024 in [facility]  PHQ-2 Total Score 0  Patient is on a Regular diet. 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 Marital status: Single. No Children. Relevant history for alcohol use is:  Social History   Substance and Sexual Activity  Alcohol Use Not Currently  . Relevant history for tobacco use is:  Social History   Tobacco Use  Smoking Status Never  Smokeless Tobacco Never  .   Review  of Systems  Constitutional: Negative.   Respiratory: Negative.    Cardiovascular: Negative.   Gastrointestinal: Negative.   Neurological: Negative.      Today's Vitals   05/23/24 1105  BP: 120/80  Pulse: (!) 59  Temp: 98.5 F (36.9 C)  TempSrc: Oral  Weight: 194 lb 9.6 oz (88.3 kg)  Height: 6' 1 (1.854 m)  PainSc: 0-No pain   Body mass index is 25.67 kg/m.  Wt Readings from Last 3 Encounters:  05/23/24 194 lb 9.6 oz (88.3 kg)  04/27/23 182 lb (82.6 kg)  04/12/23 185 lb (83.9 kg)    Objective:  Physical Exam Vitals and nursing  note reviewed.  Constitutional:      General: He is not in acute distress.    Appearance: Normal appearance. He is obese.  HENT:     Head: Normocephalic and atraumatic.     Right Ear: Tympanic membrane, ear canal and external ear normal. There is no impacted cerumen.     Left Ear: Tympanic membrane, ear canal and external ear normal. There is no impacted cerumen.     Nose: Nose normal.     Mouth/Throat:     Mouth: Mucous membranes are moist.  Cardiovascular:     Rate and Rhythm: Normal rate and regular rhythm.     Pulses: Normal pulses.     Heart sounds: Normal heart sounds. No murmur heard. Pulmonary:     Effort: Pulmonary effort is normal. No respiratory distress.     Breath sounds: Normal breath sounds. No wheezing.  Abdominal:     General: Abdomen is flat. Bowel sounds are normal. There is no distension.     Palpations: Abdomen is soft.  Genitourinary:    Comments: Deferred  Musculoskeletal:        General: Normal range of motion.     Cervical back: Normal range of motion and neck supple.  Skin:    General: Skin is warm.     Capillary Refill: Capillary refill takes less than 2 seconds.  Neurological:     General: No focal deficit present.     Mental Status: He is alert and oriented to person, place, and time.     Cranial Nerves: No cranial nerve deficit.     Motor: No weakness.  Psychiatric:        Mood and Affect: Mood normal.        Behavior: Behavior normal.        Thought Content: Thought content normal.        Judgment: Judgment normal.         Assessment And Plan:    Encounter for annual health examination Assessment & Plan: 23 year old male with no current health concerns, physically active, due for STD check and EKG. - Order STD screening including tests for hepatitis B, hepatitis C, syphilis, HIV, gonorrhea, chlamydia, and trichomonas in urine. - Order EKG. - Order routine labs.  Orders: -     CMP14+EGFR  Anomalous origin of right coronary artery -      EKG 12-Lead -     CBC with Differential/Platelet -     Lipid panel -     Ambulatory referral to Cardiology  Thyroid nodule -     TSH + free T4  Premature atrial contractions -     EKG 12-Lead -     CBC with Differential/Platelet -     Ambulatory referral to Cardiology  Bicuspid aortic valve Assessment & Plan: Congenital heart disease post-surgical repair, asymptomatic, non-compliant  with annual cardiology follow-up. Emphasized importance of cardiology follow-up. - Refer to cardiologist for annual follow-up. - Continue daily baby aspirin 81 mg. - EKG done Marked sinus Bradycardia  -Left axis for age -possible anterior fascicular block. Hr 44   Orders: -     EKG 12-Lead -     CBC with Differential/Platelet -     Ambulatory referral to Cardiology  COVID-19 vaccination declined  Encounter for screening for metabolic disorder -     Hemoglobin A1c  Encounter for HIV (human immunodeficiency virus) test -     HIV Antibody (routine testing w rflx)  Encounter for hepatitis C screening test for low risk patient -     Hepatitis C antibody  Screening for STD (sexually transmitted disease) -     Hepatitis B surface antigen -     RPR -     Chlamydia/Gonococcus/Trichomonas, NAA     Return for 1 year physical, 6 month thyroid check. Patient was given opportunity to ask questions. Patient verbalized understanding of the plan and was able to repeat key elements of the plan. All questions were answered to their satisfaction.   Gaines Ada, FNP   I, Gaines Ada, FNP, have reviewed all documentation for this visit. The documentation on 05/23/24 for the exam, diagnosis, procedures, and orders are all accurate and complete.

## 2024-05-24 ENCOUNTER — Ambulatory Visit: Payer: Self-pay | Admitting: Nurse Practitioner

## 2024-05-24 LAB — LIPID PANEL
Chol/HDL Ratio: 3 ratio (ref 0.0–5.0)
Cholesterol, Total: 178 mg/dL (ref 100–199)
HDL: 60 mg/dL (ref >39)
LDL Chol Calc (NIH): 110 mg/dL — ABNORMAL HIGH (ref 0–99)
Triglycerides: 38 mg/dL (ref 0–149)
VLDL Cholesterol Cal: 8 mg/dL (ref 5–40)

## 2024-05-24 LAB — CBC WITH DIFFERENTIAL/PLATELET
Basophils Absolute: 0.1 x10E3/uL (ref 0.0–0.2)
Basos: 1 %
EOS (ABSOLUTE): 0.1 x10E3/uL (ref 0.0–0.4)
Eos: 2 %
Hematocrit: 45.9 % (ref 37.5–51.0)
Hemoglobin: 14.5 g/dL (ref 13.0–17.7)
Immature Grans (Abs): 0 x10E3/uL (ref 0.0–0.1)
Immature Granulocytes: 0 %
Lymphocytes Absolute: 2.6 x10E3/uL (ref 0.7–3.1)
Lymphs: 32 %
MCH: 27.5 pg (ref 26.6–33.0)
MCHC: 31.6 g/dL (ref 31.5–35.7)
MCV: 87 fL (ref 79–97)
Monocytes Absolute: 0.6 x10E3/uL (ref 0.1–0.9)
Monocytes: 7 %
Neutrophils Absolute: 4.7 x10E3/uL (ref 1.4–7.0)
Neutrophils: 58 %
Platelets: 244 x10E3/uL (ref 150–450)
RBC: 5.28 x10E6/uL (ref 4.14–5.80)
RDW: 13 % (ref 11.6–15.4)
WBC: 8.1 x10E3/uL (ref 3.4–10.8)

## 2024-05-24 LAB — CMP14+EGFR
ALT: 14 IU/L (ref 0–44)
AST: 18 IU/L (ref 0–40)
Albumin: 4.6 g/dL (ref 4.3–5.2)
Alkaline Phosphatase: 90 IU/L (ref 44–121)
BUN/Creatinine Ratio: 11 (ref 9–20)
BUN: 11 mg/dL (ref 6–20)
Bilirubin Total: 0.4 mg/dL (ref 0.0–1.2)
CO2: 23 mmol/L (ref 20–29)
Calcium: 9.6 mg/dL (ref 8.7–10.2)
Chloride: 103 mmol/L (ref 96–106)
Creatinine, Ser: 1 mg/dL (ref 0.76–1.27)
Globulin, Total: 2.7 g/dL (ref 1.5–4.5)
Glucose: 77 mg/dL (ref 70–99)
Potassium: 4.6 mmol/L (ref 3.5–5.2)
Sodium: 139 mmol/L (ref 134–144)
Total Protein: 7.3 g/dL (ref 6.0–8.5)
eGFR: 109 mL/min/1.73 (ref >59)

## 2024-05-24 LAB — HIV ANTIBODY (ROUTINE TESTING W REFLEX): HIV Screen 4th Generation wRfx: NONREACTIVE

## 2024-05-24 LAB — HEPATITIS B SURFACE ANTIGEN: Hepatitis B Surface Ag: NEGATIVE

## 2024-05-24 LAB — RPR: RPR Ser Ql: NONREACTIVE

## 2024-05-24 LAB — TSH+FREE T4
Free T4: 1.29 ng/dL (ref 0.82–1.77)
TSH: 0.45 u[IU]/mL (ref 0.450–4.500)

## 2024-05-24 LAB — HEMOGLOBIN A1C
Est. average glucose Bld gHb Est-mCnc: 108 mg/dL
Hgb A1c MFr Bld: 5.4 % (ref 4.8–5.6)

## 2024-05-24 LAB — HEPATITIS C ANTIBODY: Hep C Virus Ab: NONREACTIVE

## 2024-05-25 LAB — CHLAMYDIA/GONOCOCCUS/TRICHOMONAS, NAA
Chlamydia by NAA: NEGATIVE
Gonococcus by NAA: NEGATIVE
Trich vag by NAA: NEGATIVE

## 2024-05-31 ENCOUNTER — Encounter: Payer: Self-pay | Admitting: Nurse Practitioner

## 2024-05-31 DIAGNOSIS — Z0000 Encounter for general adult medical examination without abnormal findings: Secondary | ICD-10-CM | POA: Insufficient documentation

## 2024-05-31 DIAGNOSIS — Z2821 Immunization not carried out because of patient refusal: Secondary | ICD-10-CM | POA: Insufficient documentation

## 2024-05-31 NOTE — Assessment & Plan Note (Signed)
 23 year old male with no current health concerns, physically active, due for STD check and EKG. - Order STD screening including tests for hepatitis B, hepatitis C, syphilis, HIV, gonorrhea, chlamydia, and trichomonas in urine. - Order EKG. - Order routine labs.

## 2024-05-31 NOTE — Assessment & Plan Note (Addendum)
 Congenital heart disease post-surgical repair, asymptomatic, non-compliant with annual cardiology follow-up. Emphasized importance of cardiology follow-up. - Refer to cardiologist for annual follow-up. - Continue daily baby aspirin 81 mg. - EKG done Marked sinus Bradycardia  -Left axis for age -possible anterior fascicular block. Hr 44

## 2024-11-26 ENCOUNTER — Ambulatory Visit: Payer: Self-pay | Admitting: Nurse Practitioner

## 2025-05-27 ENCOUNTER — Encounter: Payer: Self-pay | Admitting: Nurse Practitioner
